# Patient Record
Sex: Male | Born: 2005
Health system: Southern US, Community
[De-identification: ages and names within clinical notes are randomized; demographics above are authoritative.]

---
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
## 2023-09-03 ENCOUNTER — Other Ambulatory Visit (HOSPITAL_COMMUNITY): Payer: Self-pay
